# Patient Record
Sex: Female | Born: 1970 | Race: White | Hispanic: No | Marital: Married | Smoking: Never smoker
Health system: Southern US, Community
[De-identification: ages and names within clinical notes are randomized; demographics above are authoritative.]

## PROBLEM LIST (undated history)

## (undated) DIAGNOSIS — F32A Depression, unspecified: Secondary | ICD-10-CM

## (undated) DIAGNOSIS — L719 Rosacea, unspecified: Secondary | ICD-10-CM

## (undated) DIAGNOSIS — O24419 Gestational diabetes mellitus in pregnancy, unspecified control: Secondary | ICD-10-CM

## (undated) DIAGNOSIS — F419 Anxiety disorder, unspecified: Secondary | ICD-10-CM

## (undated) DIAGNOSIS — E785 Hyperlipidemia, unspecified: Secondary | ICD-10-CM

## (undated) DIAGNOSIS — F329 Major depressive disorder, single episode, unspecified: Secondary | ICD-10-CM

## (undated) DIAGNOSIS — C801 Malignant (primary) neoplasm, unspecified: Secondary | ICD-10-CM

## (undated) HISTORY — DX: Hyperlipidemia, unspecified: E78.5

## (undated) HISTORY — DX: Depression, unspecified: F32.A

## (undated) HISTORY — DX: Rosacea, unspecified: L71.9

## (undated) HISTORY — DX: Major depressive disorder, single episode, unspecified: F32.9

## (undated) HISTORY — DX: Anxiety disorder, unspecified: F41.9

## (undated) HISTORY — DX: Gestational diabetes mellitus in pregnancy, unspecified control: O24.419

---
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
## 1988-07-30 HISTORY — PX: WISDOM TOOTH EXTRACTION: SHX21

## 2004-08-22 ENCOUNTER — Encounter: Admission: RE | Admit: 2004-08-22 | Discharge: 2004-08-22 | Payer: Self-pay | Admitting: Obstetrics and Gynecology

## 2004-10-05 ENCOUNTER — Ambulatory Visit (HOSPITAL_COMMUNITY): Admission: RE | Admit: 2004-10-05 | Discharge: 2004-10-05 | Payer: Self-pay | Admitting: Obstetrics and Gynecology

## 2004-10-22 ENCOUNTER — Inpatient Hospital Stay (HOSPITAL_COMMUNITY): Admission: AD | Admit: 2004-10-22 | Discharge: 2004-10-24 | Payer: Self-pay | Admitting: Obstetrics and Gynecology

## 2004-10-22 DIAGNOSIS — O24419 Gestational diabetes mellitus in pregnancy, unspecified control: Secondary | ICD-10-CM

## 2010-06-06 ENCOUNTER — Ambulatory Visit: Payer: Self-pay

## 2011-07-09 ENCOUNTER — Ambulatory Visit: Payer: Self-pay

## 2012-07-21 HISTORY — PX: INTRAUTERINE DEVICE (IUD) INSERTION: SHX5877

## 2015-09-14 ENCOUNTER — Other Ambulatory Visit: Payer: Self-pay | Admitting: Certified Nurse Midwife

## 2015-09-14 DIAGNOSIS — Z1231 Encounter for screening mammogram for malignant neoplasm of breast: Secondary | ICD-10-CM

## 2016-10-04 ENCOUNTER — Ambulatory Visit (INDEPENDENT_AMBULATORY_CARE_PROVIDER_SITE_OTHER): Payer: BLUE CROSS/BLUE SHIELD | Admitting: Certified Nurse Midwife

## 2016-10-04 ENCOUNTER — Encounter: Payer: Self-pay | Admitting: Certified Nurse Midwife

## 2016-10-04 ENCOUNTER — Other Ambulatory Visit: Payer: Self-pay | Admitting: Certified Nurse Midwife

## 2016-10-04 VITALS — BP 102/62 | HR 86 | Ht 64.0 in | Wt 148.0 lb

## 2016-10-04 DIAGNOSIS — Z01419 Encounter for gynecological examination (general) (routine) without abnormal findings: Secondary | ICD-10-CM

## 2016-10-04 DIAGNOSIS — Z1231 Encounter for screening mammogram for malignant neoplasm of breast: Secondary | ICD-10-CM

## 2016-10-04 DIAGNOSIS — Z8632 Personal history of gestational diabetes: Secondary | ICD-10-CM | POA: Insufficient documentation

## 2016-10-04 DIAGNOSIS — Z803 Family history of malignant neoplasm of breast: Secondary | ICD-10-CM

## 2016-10-04 DIAGNOSIS — F32A Depression, unspecified: Secondary | ICD-10-CM | POA: Insufficient documentation

## 2016-10-04 DIAGNOSIS — F329 Major depressive disorder, single episode, unspecified: Secondary | ICD-10-CM | POA: Insufficient documentation

## 2016-10-04 DIAGNOSIS — Z30432 Encounter for removal of intrauterine contraceptive device: Secondary | ICD-10-CM | POA: Diagnosis not present

## 2016-10-04 DIAGNOSIS — Z124 Encounter for screening for malignant neoplasm of cervix: Secondary | ICD-10-CM

## 2016-10-04 NOTE — Progress Notes (Signed)
Gynecology Annual Exam  PCP: [facility]  Chief Complaint:  Chief Complaint  Patient presents with  . Gynecologic Exam  . iud removal    History of Present Illness: Patient is a 46 y.o. G1P1 presents for annual exam and IUD removal. Husband had a vasectomy last June and no longer needs Mirena for contraception.  The patient has no significant gyn complaints today. Is going to go see a functional doctor in the near future to evaluate her depression and for preventitative recommendations. On the Mirena IUD she has had occasional spotting. Had more of a normal menses with her LMP 09/29/2016. She is still bleeding lightly.  Dysmenorrhea: no   The patient is sexually active. She currently uses IUD: Mirena which was inserted 06/2012. Husband had vasecomy 12/2015 but has not gone back for post vas sperm analysis.  for contraception.  Her  last pap: was done 09/14/2015 and was NIL/NEG. No history of abnormal Pap smears and last mammogram: 08/25/2014 was negative. She does not do SBE. Mother had breast cancer at age 80. No genetic testing has been done  The patient has regular exercise: no.  The patient has ever been transfused or tattooed?: not asked.  The patient reports that domestic violence in her life is absent.    PCP is DR Glendon Axe at [facility]. PCP does screening labs for patient. Last lipid panel 2017 was WNL.  Review of Systems: Review of Systems  Constitutional: Negative for chills, fever and weight loss.  HENT: Negative for congestion, sinus pain and sore throat.   Eyes: Negative for blurred vision and pain.       Positive for eye irritation  Respiratory: Negative for hemoptysis, shortness of breath and wheezing.   Cardiovascular: Negative for chest pain, palpitations and leg swelling.  Gastrointestinal: Positive for heartburn. Negative for abdominal pain, blood in stool, diarrhea, nausea and vomiting.  Genitourinary: Negative for dysuria, frequency, hematuria and  urgency.       Positive for irregular bleeding  Musculoskeletal: Negative for back pain, joint pain and myalgias.  Skin: Negative for itching and rash.  Neurological: Negative for dizziness, tingling and headaches.  Endo/Heme/Allergies: Negative for environmental allergies and polydipsia. Does not bruise/bleed easily.       Negative for hirsutism   Psychiatric/Behavioral: Positive for depression. The patient is not nervous/anxious and does not have insomnia.     Past Medical History:  Past Medical History:  Diagnosis Date  . Anxiety   . Depression   . Gestational diabetes   . Hyperlipidemia   . Rosacea     Past Surgical History:  Past Surgical History:  Procedure Laterality Date  . INTRAUTERINE DEVICE (IUD) INSERTION  07/21/2012   mirena    Gynecologic History:  Patient's last menstrual period was 09/29/2016 (approximate).  Obstetric History: G1P1  SVD 10/23/2014 Gestational diabetes with pregnancy Family History:  Family History  Problem Relation Age of Onset  . Breast cancer Mother 46  . Hypertension Father   . Depression Father   . Alzheimer's disease Father   . Colon cancer Maternal Grandmother 39  . Prostate cancer Maternal Grandfather   . Bone cancer Paternal Grandfather 26    Social History:  Social History   Social History  . Marital status: Married    Spouse name: N/A  . Number of children: 1  . Years of education: N/A   Occupational History  . Not on file.   Social History Main Topics  . Smoking  status: Never Smoker  . Smokeless tobacco: Never Used  . Alcohol use Yes     Comment: rarely  . Drug use: No  . Sexual activity: Yes    Birth control/ protection: IUD   Other Topics Concern  . Not on file   Social History Narrative  . No narrative on file    Allergies:  Allergies  Allergen Reactions  . Neosporin [Neomycin-Bacitracin Zn-Polymyx]   . Sulfa Antibiotics Rash    Medications: Prior to Admission medications   Medication Sig  Start Date End Date Taking? Authorizing Provider  Ascorbic Acid (VITAMIN C) 1000 MG tablet Take 1,000 mg by mouth daily.   Yes Historical Provider, MD  Cholecalciferol (VITAMIN D3) 5000 units TABS Take by mouth.   Yes Historical Provider, MD  escitalopram (LEXAPRO) 10 MG tablet Take 10 mg by mouth daily.   Yes Historical Provider, MD  levonorgestrel (MIRENA) 20 MCG/24HR IUD 1 each by Intrauterine route once.   Yes Historical Provider, MD  Magnesium 200 MG TABS Take 1 tablet by mouth.   Yes Historical Provider, MD  Multiple Vitamin (MULTIVITAMIN) tablet Take 1 tablet by mouth daily.    Historical Provider, MD    Physical Exam Vitals: Blood pressure 102/62, pulse 86, height 5' 4"  (1.626 m), weight 148 lb (67.1 kg), last menstrual period 09/29/2016.  General: NAD HEENT: normocephalic, anicteric Thyroid: no enlargement, no palpable nodules Pulmonary: No increased work of breathing, CTAB Cardiovascular: RRR without murmur Breast: Breast symmetrical, no tenderness, no palpable nodules or masses, no skin or nipple retraction present, no nipple discharge.  No axillary or supraclavicular lymphadenopathy. Abdomen: soft, non-tender, non-distended.  Umbilicus without lesions.  No hepatomegaly, no masses palpable. No evidence of hernia  Genitourinary:  External: Normal external female genitalia.  Normal urethral meatus, normal  Bartholin's and Skene's glands.    Vagina: Normal vaginal mucosa, no evidence of prolapse, small amt blood in vault   Cervix: Grossly normal in appearance, small amt bleeding, IUD strings present. IUD strings grasped with ring forceps and IUD removed easily intact  Uterus: MP to RV, NSSC, mobile,NT   Adnexa: ovaries non-enlarged, no adnexal masses  Rectal: deferred  Lymphatic: no evidence of inguinal lymphadenopathy Extremities: no edema, erythema, or tenderness Neurologic: Grossly intact Psychiatric: mood appropriate, affect full        Assessment:46 year old WF with  normal well woman exam Desires IUD removal    Plan: Problem List Items Addressed This Visit    History of gestational diabetes   Family history of breast cancer in mother    Other Visit Diagnoses    Encounter for gynecological examination    -  Primary   Relevant Orders   IGP,rfx Aptima HPV all pth   Encounter for removal of intrauterine contraceptive device (IUD)       Screening for cervical cancer       Relevant Orders   IGP,rfx Aptima HPV all pth      1) Mammogram  - recommend yearly screening mammogram -patient to schedule at [facility], recommend 3D screening due to dense breasts  2.) Discussed calcium and vitamin requirements. Recommend increasing exercise  3) Routine healthcare maintenance including cholesterol, diabetes screeningthru PCP  6) Anticipatory guidance regarding perimenopausal bleeding   7) Follow up 1 year for routine annual  Dalia Heading, [place]

## 2016-10-06 LAB — IGP,RFX APTIMA HPV ALL PTH: PAP Smear Comment: 0

## 2016-10-29 ENCOUNTER — Ambulatory Visit
Admission: RE | Admit: 2016-10-29 | Discharge: 2016-10-29 | Disposition: A | Discharge status: Home / Self Care | Payer: BLUE CROSS/BLUE SHIELD | Source: Ambulatory Visit | Attending: Certified Nurse Midwife | Admitting: Certified Nurse Midwife

## 2016-10-29 ENCOUNTER — Encounter: Payer: Self-pay | Admitting: Radiology

## 2016-10-29 DIAGNOSIS — Z1231 Encounter for screening mammogram for malignant neoplasm of breast: Secondary | ICD-10-CM | POA: Diagnosis present

## 2016-11-06 ENCOUNTER — Other Ambulatory Visit: Payer: Self-pay | Admitting: *Deleted

## 2016-11-06 ENCOUNTER — Inpatient Hospital Stay
Admission: RE | Admit: 2016-11-06 | Discharge: 2016-11-06 | Disposition: A | Discharge status: Home / Self Care | Payer: Self-pay | Source: Ambulatory Visit | Attending: *Deleted | Admitting: *Deleted

## 2016-11-06 DIAGNOSIS — Z1231 Encounter for screening mammogram for malignant neoplasm of breast: Secondary | ICD-10-CM

## 2018-01-06 ENCOUNTER — Other Ambulatory Visit: Payer: Self-pay | Admitting: Certified Nurse Midwife

## 2018-02-07 ENCOUNTER — Ambulatory Visit: Payer: BLUE CROSS/BLUE SHIELD | Admitting: Certified Nurse Midwife

## 2018-03-12 ENCOUNTER — Ambulatory Visit: Payer: BLUE CROSS/BLUE SHIELD | Admitting: Certified Nurse Midwife

## 2018-04-06 NOTE — Progress Notes (Signed)
Gynecology Annual Exam  PCP: Katheren Shams  Chief Complaint:  Chief Complaint  Patient presents with  . Gynecologic Exam    No Complaints    History of Present Illness: Patient is a 47 y.o. WF G1P1 who presents for her annual gynecological exam   The patient has no significant gyn complaints today.  She had her Mirena IUD removed at her last annual 10/04/2016. After she had her IUD removed, she had a couple of menses in the fall of 2018, but has been amenorrheic since then. Denies hot flashes . Since her last annual she has seen a functional MD who has prescribed her progesterone 100 mgm that she takes daily, except for the 1st to the 5th of each month, which has helped her sleeping. She also takes N acetyl L cysteine, ascorbic acid, vitamin D3, omega 3 fatty acids, and saw palmetto (hair loss) supplements. Extensive labs by functional physician Modena Nunnery) revealed low estradiol The patient is sexually active. She currently uses vasectomy for contraception.  Her  last pap: was done 10/04/2016 and was NIL. No history of abnormal Pap smears and last mammogram: 10/29/2016 was negative. She does not do SBE. Mother had breast cancer at age 74. No genetic testing has been done  The patient has regular exercise: no.   The patient reports that domestic violence in her life is absent.   Past medical history is remarkable for depression/ anxiety, rosacea, and gestational diabetes.  PCP is DR Glendon Axe at [facility]. PCP does screening labs for patient. Last lipid panel 2018 was WNL.  Review of Systems: Review of Systems  Constitutional: Negative for chills, fever and weight loss.  HENT: Negative for congestion, sinus pain and sore throat.   Eyes: Negative for blurred vision and pain.       Positive for eye irritation  Respiratory: Negative for hemoptysis, shortness of breath and wheezing.   Cardiovascular: Negative for chest pain, palpitations and leg swelling.  Gastrointestinal: Positive  for heartburn. Negative for abdominal pain, blood in stool, diarrhea, nausea and vomiting.  Genitourinary: Negative for dysuria, frequency, hematuria and urgency.       Positive for irregular bleeding  Musculoskeletal: Negative for back pain, joint pain and myalgias.  Skin: Negative for itching and rash.  Neurological: Negative for dizziness, tingling and headaches.  Endo/Heme/Allergies: Negative for environmental allergies and polydipsia. Does not bruise/bleed easily.       Negative for hirsutism   Psychiatric/Behavioral: Positive for depression. The patient is not nervous/anxious and does not have insomnia.     Past Medical History:  Past Medical History:  Diagnosis Date  . Anxiety   . Depression   . Gestational diabetes   . Hyperlipidemia   . Rosacea     Past Surgical History:  Past Surgical History:  Procedure Laterality Date  . EYE SURGERY Bilateral 1978  . INTRAUTERINE DEVICE (IUD) INSERTION  07/21/2012   mirena  . [facility] EXTRACTION  1990    Gynecologic History:  No LMP recorded. (Menstrual status: Perimenopausal).  Obstetric History: G1P1  SVD 10/22/2004 Gestational diabetes with pregnancy Family History:  Family History  Problem Relation Age of Onset  . Breast cancer Mother 18       in 37  . Hypertension Father   . Depression Father   . Alzheimer's disease Father   . Colon cancer Maternal Grandmother 89  . Prostate cancer Maternal Grandfather   . Bone cancer Paternal Grandfather 17  . Colon cancer Paternal Grandmother  60  . Heart attack Paternal Grandmother        died from MI    Social History:  Social History   Socioeconomic History  . Marital status: Married    Spouse name: Not on file  . Number of children: 1  . Years of education: Not on file  . Highest education level: Not on file  Occupational History  . Not on file  Social Needs  . Financial resource strain: Not on file  . Food insecurity:    Worry: Not on file    Inability: Not  on file  . Transportation needs:    Medical: Not on file    Non-medical: Not on file  Tobacco Use  . Smoking status: Never Smoker  . Smokeless tobacco: Never Used  Substance and Sexual Activity  . Alcohol use: Yes    Comment: rarely  . Drug use: No  . Sexual activity: Yes    Partners: Male    Birth control/protection: IUD  Lifestyle  . Physical activity:    Days per week: 0 days    Minutes per session: Not on file  . Stress: Not on file  Relationships  . Social connections:    Talks on phone: Not on file    Gets together: Not on file    Attends religious service: Not on file    Active member of club or organization: Not on file    Attends meetings of clubs or organizations: Not on file    Relationship status: Not on file  . Intimate partner violence:    Fear of current or ex partner: Not on file    Emotionally abused: Not on file    Physically abused: Not on file    Forced sexual activity: Not on file  Other Topics Concern  . Not on file  Social History Narrative  . Not on file    Allergies:  Allergies  Allergen Reactions  . Neosporin [Neomycin-Bacitracin Zn-Polymyx]   . Sulfa Antibiotics Rash    Medications:  Current Outpatient Medications on File Prior to Visit  Medication Sig Dispense Refill  . Acetylcysteine (N-ACETYL-L-CYSTEINE PO) Take 1 tablet by mouth 2 (two) times daily.    . Ascorbic Acid (VITAMIN C) 1000 MG tablet Take 1,000 mg by mouth daily.    . Cholecalciferol (VITAMIN D3) 5000 units TABS Take by mouth.    . doxycycline (PERIOSTAT) 20 MG tablet TK 1 T PO QD  1  . escitalopram (LEXAPRO) 10 MG tablet Take 10 mg by mouth daily.    . Multiple Vitamin (MULTIVITAMIN) tablet Take 1 tablet by mouth daily.    . Omega-3 Fatty Acids (FISH OIL) 1000 MG CPDR Take 2 tablets by mouth daily.    . progesterone (PROMETRIUM) 100 MG capsule TK 2 CS PO ONCE A DAY ON DAYS 15 THROUGH 28 OF YOUR CYCLE  2  . saw palmetto 500 MG capsule Take 500 mg by mouth daily.    .  Magnesium 200 MG TABS Take 1 tablet by mouth.     No current facility-administered medications on file prior to visit.        Physical Exam Vitals: BP 94/60 (BP Location: Right Arm, Patient Position: Sitting, Cuff Size: Normal)   Pulse 92   Ht 5' 4"  (1.626 m)   Wt 145 lb (65.8 kg)   BMI 24.89 kg/m  General: WF in  NAD HEENT: normocephalic, anicteric Thyroid: no enlargement, no palpable nodules Pulmonary: No increased work of breathing, CTAB  Cardiovascular: RRR without murmur Breast: Breast symmetrical, no tenderness, no palpable nodules or masses, no skin or nipple retraction present, no nipple discharge.  No axillary or supraclavicular lymphadenopathy. Abdomen: soft, non-tender, non-distended.  Umbilicus without lesions.  No hepatomegaly, no masses palpable. No evidence of hernia  Genitourinary:  External: Normal external female genitalia.  Normal urethral meatus, normal  Bartholin's and Skene's glands.    Vagina: Normal vaginal mucosa, no evidence of prolapse  Cervix: Grossly normal in appearance, NT  Uterus: MP, NSSC, mobile,NT   Adnexa: ovaries non-enlarged, no adnexal masses  Rectal: deferred  Lymphatic: no evidence of inguinal lymphadenopathy Extremities: no edema, erythema, or tenderness Neurologic: Grossly intact Psychiatric: mood appropriate, affect full        Assessment:47 year old WF with normal well woman exam Perimenopausal bleeding pattern. Amenorrhea since Fall 2018    Plan:1) Mammogram  - recommend yearly screening mammogram -patient to schedule at [facility], recommend 3D screening due to dense breasts  2.) Discussed calcium and vitamin requirements. Recommend increasing exercise  3) Routine healthcare maintenance including cholesterol, diabetes screening thru PCP  4) Anticipatory guidance regarding perimenopausal bleeding   5) Pap done  6) Follow up 1 year for routine annual  Dalia Heading, [place]

## 2018-04-07 ENCOUNTER — Encounter: Payer: Self-pay | Admitting: Certified Nurse Midwife

## 2018-04-07 ENCOUNTER — Other Ambulatory Visit: Payer: Self-pay

## 2018-04-07 ENCOUNTER — Ambulatory Visit (INDEPENDENT_AMBULATORY_CARE_PROVIDER_SITE_OTHER): Payer: BLUE CROSS/BLUE SHIELD | Admitting: Certified Nurse Midwife

## 2018-04-07 ENCOUNTER — Other Ambulatory Visit (HOSPITAL_COMMUNITY)
Admission: RE | Admit: 2018-04-07 | Discharge: 2018-04-07 | Disposition: A | Discharge status: Home / Self Care | Payer: BLUE CROSS/BLUE SHIELD | Source: Ambulatory Visit | Attending: Obstetrics and Gynecology | Admitting: Obstetrics and Gynecology

## 2018-04-07 VITALS — BP 94/60 | HR 92 | Ht 64.0 in | Wt 145.0 lb

## 2018-04-07 DIAGNOSIS — Z01419 Encounter for gynecological examination (general) (routine) without abnormal findings: Secondary | ICD-10-CM | POA: Diagnosis present

## 2018-04-07 DIAGNOSIS — Z1272 Encounter for screening for malignant neoplasm of vagina: Secondary | ICD-10-CM | POA: Insufficient documentation

## 2018-04-07 DIAGNOSIS — Z1231 Encounter for screening mammogram for malignant neoplasm of breast: Secondary | ICD-10-CM

## 2018-04-07 DIAGNOSIS — Z124 Encounter for screening for malignant neoplasm of cervix: Secondary | ICD-10-CM

## 2018-04-07 DIAGNOSIS — Z1239 Encounter for other screening for malignant neoplasm of breast: Secondary | ICD-10-CM

## 2018-04-07 NOTE — Patient Instructions (Signed)
Preventing Osteoporosis, Adult Osteoporosis is a condition that causes the bones to get weaker. With osteoporosis, the bones become thinner, and the normal spaces in bone tissue become larger. This can make the bones weak and cause them to break more easily. People who have osteoporosis are more likely to break their wrist, spine, or hip. Even a minor accident or injury can be enough to break weak bones. Osteoporosis can occur with aging. Your body constantly replaces old bone tissue with new tissue. As you get older, you may lose bone tissue more quickly, or it may be replaced more slowly. Osteoporosis is more likely to develop if you have poor nutrition or do not get enough calcium or vitamin D. Other lifestyle factors can also play a role. By making some diet and lifestyle changes, you can help to keep your bones healthy and help to prevent osteoporosis. What nutrition changes can be made? Nutrition plays an important role in maintaining healthy, strong bones.  Make sure you get enough calcium every day from food or from calcium supplements. ? If you are age 49 or younger, aim to get 1,000 mg of calcium every day. ? If you are older than age 49, aim to get 1,200 mg of calcium every day.  Try to get enough vitamin D every day. ? If you are age 80 or younger, aim to get 600 international units (IU) every day. ? If you are older than age 22, aim to get 800 international units every day.  Follow a healthy diet. Eat plenty of foods that contain calcium and vitamin D. ? Calcium is in milk, cheese, yogurt, and other dairy products. Some fish and vegetables are also good sources of calcium. Many foods such as cereals and breads have had calcium added to them (are fortified). Check nutrition labels to see how much calcium is in a food or drink. ? Foods that contain vitamin D include milk, cereals, salmon, and tuna. Your body also makes vitamin D when you are out in the sun. Bare skin exposure to the sun on  your face, arms, legs, or back for no more than 30 minutes a day, 2 times per week is more than enough. Beyond that, it is important to use sunblock to protect your skin from sunburn, which increases your risk for skin cancer.  What lifestyle changes can be made? Making changes in your everyday life can also play an important role in preventing osteoporosis.  Stay active and get exercise every day. Ask your health care provider what types of exercise are best for you.  Do not use any products that contain nicotine or tobacco, such as cigarettes and e-cigarettes. If you need help quitting, ask your health care provider.  Limit alcohol intake to no more than 1 drink a day for nonpregnant women and 2 drinks a day for men. One drink equals 12 oz of beer, 5 oz of wine, or 1 oz of hard liquor.  Why are these changes important? Making these nutrition and lifestyle changes can:  Help you develop and maintain healthy, strong bones.  Prevent loss of bone mass and the problems that are caused by that loss, such as broken bones and delayed healing.  Make you feel better mentally and physically.  What can happen if changes are not made? Problems that can result from osteoporosis can be very serious. These may include:  A higher risk of broken bones that are painful and do not heal well.  Physical malformations, such as  a collapsed spine or a hunched back.  Problems with movement.  Where to find support: If you need help making changes to prevent osteoporosis, talk with your health care provider. You can ask for a referral to a diet and nutrition specialist (dietitian) and a physical therapist. Where to find more information: Learn more about osteoporosis from:  NIH Osteoporosis and Related [place]: www.niams.GolfingGoddess.com.br  U.S. Office on Women's Health:  SouvenirBaseball.es.html  National Osteoporosis Foundation: ProfilePeek.ch  Summary  Osteoporosis is a condition that causes weak bones that are more likely to break.  Eating a healthy diet and making sure you get enough calcium and vitamin D can help prevent osteoporosis.  Other ways to reduce your risk of osteoporosis include getting regular exercise and avoiding alcohol and products that contain nicotine or tobacco. This information is not intended to replace advice given to you by your health care provider. Make sure you discuss any questions you have with your health care provider. Document Released: 07/31/2015 Document Revised: 03/26/2016 Document Reviewed: 03/26/2016 Elsevier Interactive Patient Education  [employer].

## 2018-04-08 LAB — CYTOLOGY - PAP: Diagnosis: NEGATIVE

## 2018-04-09 ENCOUNTER — Encounter: Payer: Self-pay | Admitting: Certified Nurse Midwife

## 2018-04-22 ENCOUNTER — Ambulatory Visit
Admission: RE | Admit: 2018-04-22 | Discharge: 2018-04-22 | Disposition: A | Discharge status: Home / Self Care | Payer: BLUE CROSS/BLUE SHIELD | Source: Ambulatory Visit | Attending: Certified Nurse Midwife | Admitting: Certified Nurse Midwife

## 2018-04-22 ENCOUNTER — Encounter: Payer: Self-pay | Admitting: Radiology

## 2018-04-22 DIAGNOSIS — Z1231 Encounter for screening mammogram for malignant neoplasm of breast: Secondary | ICD-10-CM | POA: Diagnosis not present

## 2018-04-22 DIAGNOSIS — Z1239 Encounter for other screening for malignant neoplasm of breast: Secondary | ICD-10-CM

## 2019-03-30 ENCOUNTER — Other Ambulatory Visit: Payer: Self-pay | Admitting: Certified Nurse Midwife

## 2019-04-15 NOTE — Progress Notes (Signed)
Gynecology Annual Exam  PCP: [facility]  Chief Complaint:  Chief Complaint  Patient presents with  . Gynecologic Exam    History of Present Illness: Patient is a 48 y.o. WF G1P1 who presents for her annual gynecological exam  Since her last annual exam in 2019, she has lost her father to dementia. She also lost several other family members and some friends last fall.  She had her Mirena IUD removed 10/04/2016. After she had her IUD removed, she had a couple of menses in the fall of 2018. Had no further bleeding until April and May of this year. She had a "normal" period each of these months, lasting 5-7 days, with a medium flow..She sees a functional MD Modena Nunnery) who has prescribed her progesterone 100 mgm that she has been taking daily, except for the 1st to the 5th of each month for the last 2 years. She also takes N acetyl L cysteine, ascorbic acid, vitamin D3,, and saw palmetto (hair loss) supplements.  The patient is sexually active. She currently uses vasectomy for contraception.  Her  last pap: was done 04/07/2018  and was NIL. No history of abnormal Pap smears and last mammogram: 04/22/2018 was negative. She does not do SBE. Mother had breast cancer at age 58. No genetic testing has been done  The patient has regular exercise: Yes, walks 2 miles, 2-3 times a week.   Past medical history is remarkable for depression/ anxiety, rosacea, and gestational diabetes.   Last lipid panel 2019 was WNL.  Review of Systems: Review of Systems  Constitutional: Negative for chills, fever and weight loss.  HENT: Negative for congestion, sinus pain and sore throat.   Eyes: Negative for blurred vision and pain.  Respiratory: Negative for hemoptysis, shortness of breath and wheezing.   Cardiovascular: Negative for chest pain, palpitations and leg swelling.  Gastrointestinal: Negative for abdominal pain, blood in stool, diarrhea, heartburn, nausea and vomiting.  Genitourinary:  Negative for dysuria, frequency, hematuria and urgency.  Musculoskeletal: Positive for joint pain. Negative for back pain and myalgias.  Skin: Negative for itching and rash.  Neurological: Negative for dizziness, tingling and headaches.  Endo/Heme/Allergies: Negative for environmental allergies and polydipsia. Does not bruise/bleed easily.       Negative for hirsutism   Psychiatric/Behavioral: Negative for depression. The patient is not nervous/anxious and does not have insomnia.     Past Medical History:  Past Medical History:  Diagnosis Date  . Anxiety   . Depression   . Gestational diabetes   . Hyperlipidemia   . Rosacea     Past Surgical History:  Past Surgical History:  Procedure Laterality Date  . EYE SURGERY Bilateral 1978  . INTRAUTERINE DEVICE (IUD) INSERTION  07/21/2012   mirena  . [place] EXTRACTION  1990    Gynecologic History:  No LMP recorded (lmp unknown). Patient is perimenopausal.  Obstetric History: G1P1  SVD 10/22/2004 Gestational diabetes with pregnancy Family History:  Family History  Problem Relation Age of Onset  . Breast cancer Mother 39       in 24  . Hypertension Father   . Depression Father   . Alzheimer's disease Father   . Colon cancer Maternal Grandmother 33  . Prostate cancer Maternal Grandfather   . Bone cancer Paternal Grandfather 66  . Colon cancer Paternal Grandmother 73  . Heart attack Paternal Grandmother        died from MI    Social History:  Social  History   Socioeconomic History  . Marital status: Married    Spouse name: Not on file  . Number of children: 1  . Years of education: Not on file  . Highest education level: Not on file  Occupational History  . Not on file  Social Needs  . Financial resource strain: Not on file  . Food insecurity    Worry: Not on file    Inability: Not on file  . Transportation needs    Medical: Not on file    Non-medical: Not on file  Tobacco Use  . Smoking status: Never  Smoker  . Smokeless tobacco: Never Used  Substance and Sexual Activity  . Alcohol use: Yes    Comment: rarely  . Drug use: No  . Sexual activity: Yes    Partners: Male    Birth control/protection: Surgical    Comment: vasectomy  Lifestyle  . Physical activity    Days per week: 0 days    Minutes per session: Not on file  . Stress: Not on file  Relationships  . Social Herbalist on phone: Not on file    Gets together: Not on file    Attends religious service: Not on file    Active member of club or organization: Not on file    Attends meetings of clubs or organizations: Not on file    Relationship status: Not on file  . Intimate partner violence    Fear of current or ex partner: Not on file    Emotionally abused: Not on file    Physically abused: Not on file    Forced sexual activity: Not on file  Other Topics Concern  . Not on file  Social History Narrative  . Not on file    Allergies:  Allergies  Allergen Reactions  . Neosporin [Neomycin-Bacitracin Zn-Polymyx]   . Sulfa Antibiotics Rash    Medications:  Current Outpatient Medications on File Prior to Visit  Medication Sig Dispense Refill  . Acetylcysteine (N-ACETYL-L-CYSTEINE PO) Take 1 tablet by mouth 2 (two) times daily.    . Ascorbic Acid (VITAMIN C) 1000 MG tablet Take 1,000 mg by mouth daily.    . Cholecalciferol (VITAMIN D3) 5000 units TABS Take by mouth.    . escitalopram (LEXAPRO) 10 MG tablet Take 10 mg by mouth daily.    . Magnesium 200 MG TABS Take 1 tablet by mouth.    . Multiple Vitamin (MULTIVITAMIN) tablet Take 1 tablet by mouth daily.    . progesterone (PROMETRIUM) 100 MG capsule TK 2 CS PO ONCE A DAY ON DAYS 15 THROUGH 28 OF YOUR CYCLE  2  . progesterone (PROMETRIUM) 200 MG capsule Take 1 capsule by mouth daily.    Marland Kitchen saccharomyces boulardii (FLORASTOR) 250 MG capsule Take 1 capsule by mouth daily.    . saw palmetto 500 MG capsule Take 500 mg by mouth daily.     No current  facility-administered medications on file prior to visit.        Physical Exam Vitals: BP 100/60   Pulse 85   Ht 5' 4"  (1.626 m)   Wt 146 lb (66.2 kg)   LMP  (LMP Unknown)   BMI 25.06 kg/m  General: WF in  NAD HEENT: normocephalic, anicteric Thyroid: no enlargement, no palpable nodules Pulmonary: No increased work of breathing, CTAB Cardiovascular: RRR without murmur Breast: Breast symmetrical, no tenderness, no palpable nodules or masses, no skin or nipple retraction present, no nipple discharge.  No axillary or supraclavicular lymphadenopathy. Abdomen: soft, non-tender, non-distended.  Umbilicus without lesions.  No hepatomegaly, no masses palpable. No evidence of hernia  Genitourinary:  External: Normal external female genitalia.  Normal urethral meatus, normal Bartholin's and Skene's glands.    Vagina: Normal vaginal mucosa, no evidence of prolapse  Cervix: Grossly normal in appearance, NT  Uterus: MP, NSSC, mobile,NT   Adnexa: ovaries non-enlarged, no adnexal masses  Rectal: deferred  Lymphatic: no evidence of inguinal lymphadenopathy Extremities: no edema, erythema, or tenderness Neurologic: Grossly intact Psychiatric: mood appropriate, affect full        Assessment:48 year old WF with normal well woman exam ? Postmenopausal vs perimenopausal bleeding-is currently taking progesterone which reduces her risk of endometrial hyperplasia    Plan:1) Mammogram  - recommend yearly screening mammogram -mammogram scheduled, patient to scheduled after 04/23/2019  2.) Discussed calcium and vitamin requirements. Recommend increasing exercise  3 Routine screening for cholesterol and diabetes UTD.   4) Advised to let me know if has any further bleeding. Will order pelvic ultrasound to evaluate endometrium  5) Cervical cancer screening: Pap done. Discussed [employer] insurance recent policy change on reimbursing for Pap smears. May need to pay for Pap smears if done more  frequently than every 3 years.  6) Colon cancer screening: Explained options for colon cancer screening (hemoccult, Cologuard, colonoscopy). She desires to do Cologuard.   7) Follow up 1 year for routine annual  Dalia Heading, [place]

## 2019-04-16 ENCOUNTER — Other Ambulatory Visit (HOSPITAL_COMMUNITY)
Admission: RE | Admit: 2019-04-16 | Discharge: 2019-04-16 | Disposition: A | Discharge status: Home / Self Care | Payer: BC Managed Care – PPO | Source: Ambulatory Visit | Attending: Certified Nurse Midwife | Admitting: Certified Nurse Midwife

## 2019-04-16 ENCOUNTER — Ambulatory Visit (INDEPENDENT_AMBULATORY_CARE_PROVIDER_SITE_OTHER): Payer: BC Managed Care – PPO | Admitting: Certified Nurse Midwife

## 2019-04-16 ENCOUNTER — Encounter: Payer: Self-pay | Admitting: Certified Nurse Midwife

## 2019-04-16 ENCOUNTER — Other Ambulatory Visit: Payer: Self-pay

## 2019-04-16 VITALS — BP 100/60 | HR 85 | Ht 64.0 in | Wt 146.0 lb

## 2019-04-16 DIAGNOSIS — Z124 Encounter for screening for malignant neoplasm of cervix: Secondary | ICD-10-CM | POA: Insufficient documentation

## 2019-04-16 DIAGNOSIS — Z1211 Encounter for screening for malignant neoplasm of colon: Secondary | ICD-10-CM

## 2019-04-16 DIAGNOSIS — Z01419 Encounter for gynecological examination (general) (routine) without abnormal findings: Secondary | ICD-10-CM | POA: Insufficient documentation

## 2019-04-16 DIAGNOSIS — Z1239 Encounter for other screening for malignant neoplasm of breast: Secondary | ICD-10-CM

## 2019-04-21 LAB — CYTOLOGY - PAP
Diagnosis: NEGATIVE
High risk HPV: NEGATIVE
Molecular Disclaimer: 56
Molecular Disclaimer: DETECTED
Molecular Disclaimer: NORMAL

## 2019-04-21 IMAGING — MG MM DIGITAL SCREENING BILAT W/ TOMO W/ CAD
8 series · 8 of 24 positions shown · non-contrast
Comparison: Previous exam(s).

CLINICAL DATA: Screening.

EXAM:
DIGITAL SCREENING BILATERAL MAMMOGRAM WITH TOMO AND CAD

[R MLO synth-2D]
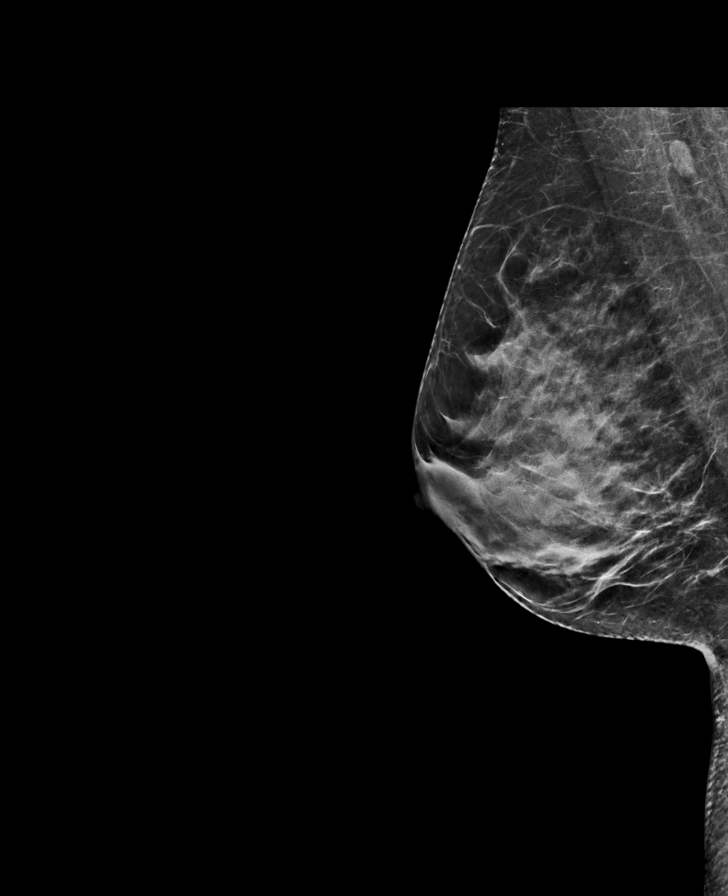

[L CC synth-2D]
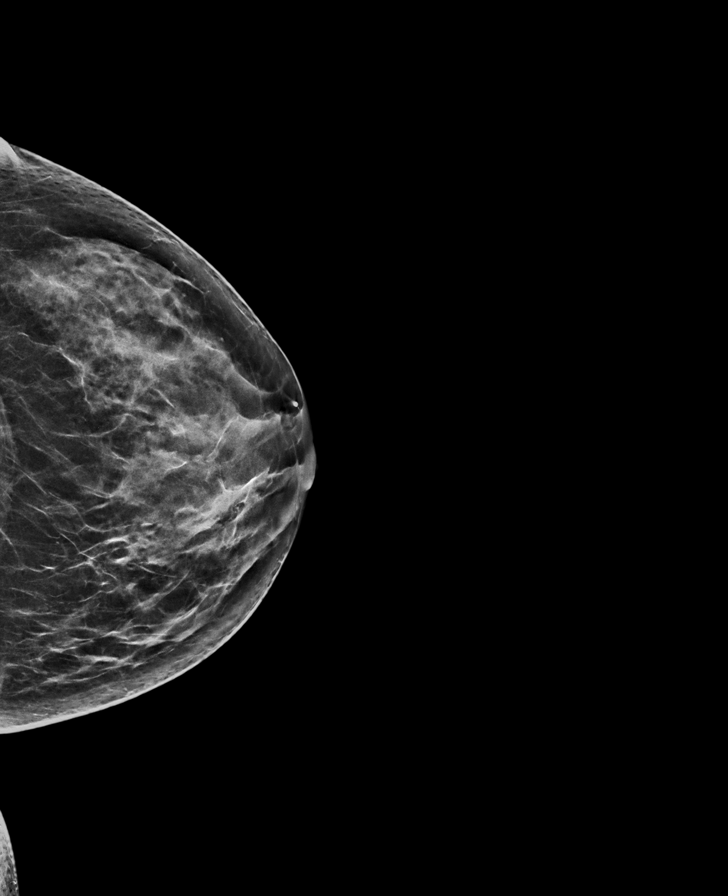

[L MLO synth-2D]
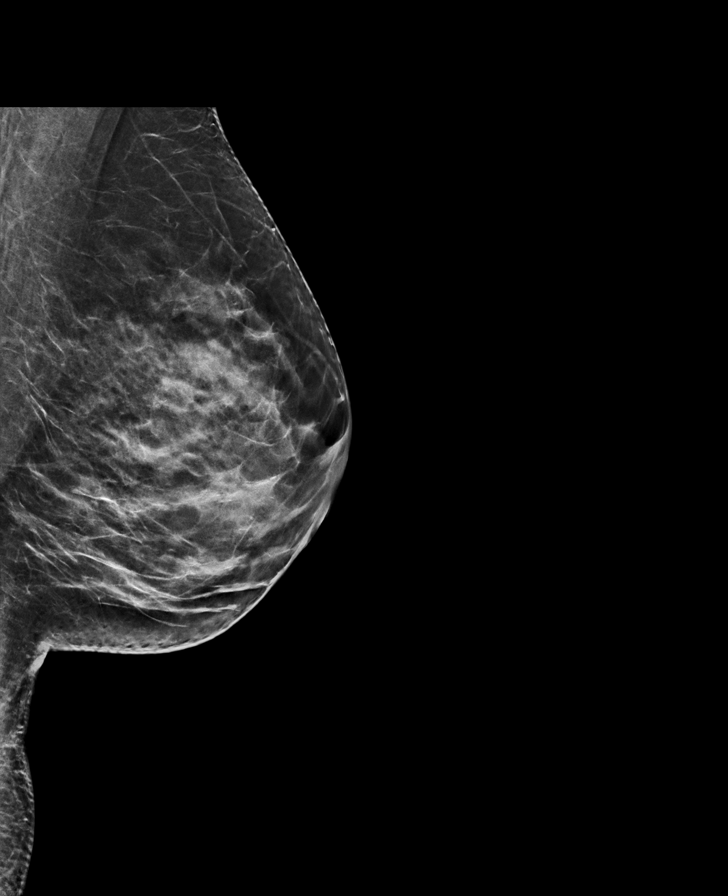

[R CC synth-2D]
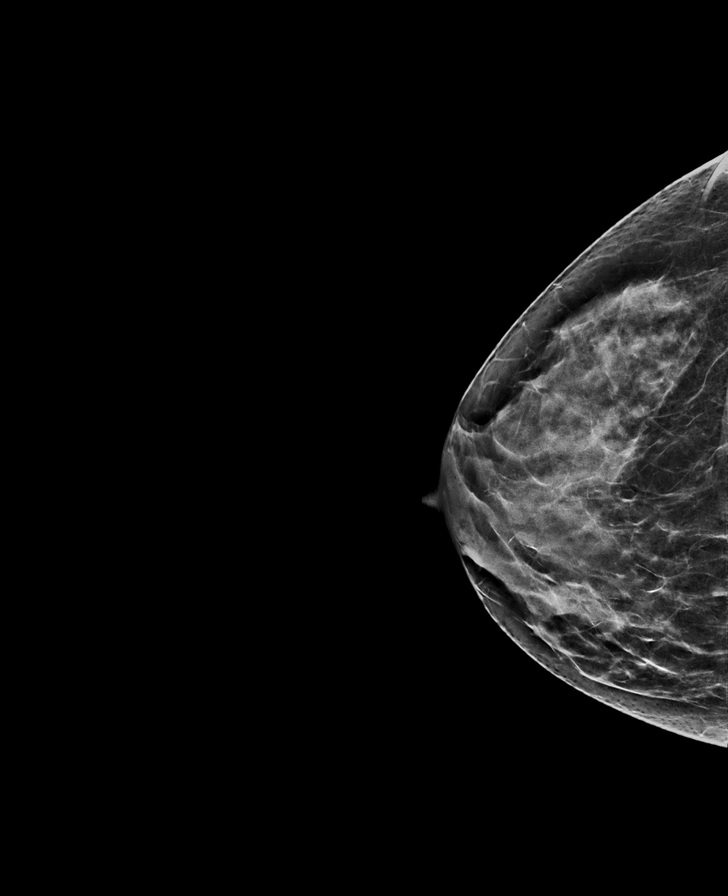

[R MLO tomo · tomo slice 31/61.0]
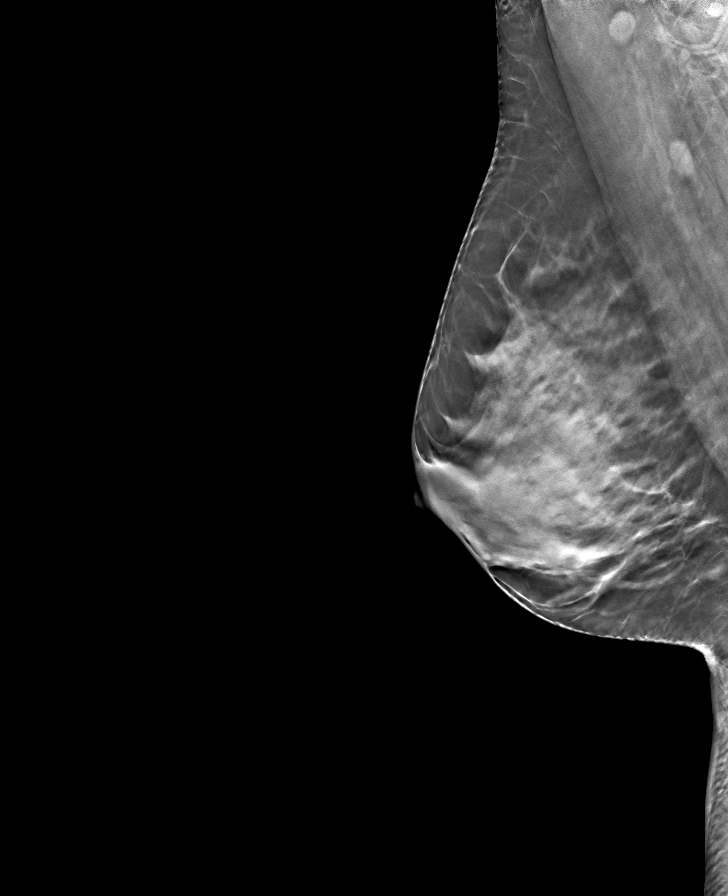

[R CC tomo · tomo slice 31/60.0]
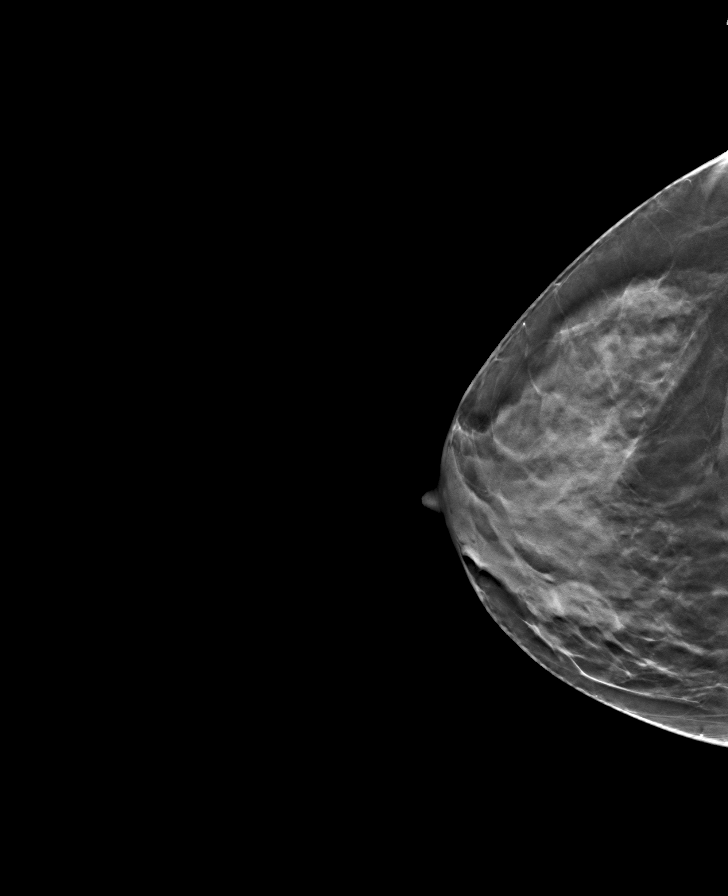

[L CC tomo · tomo slice 31/62.0]
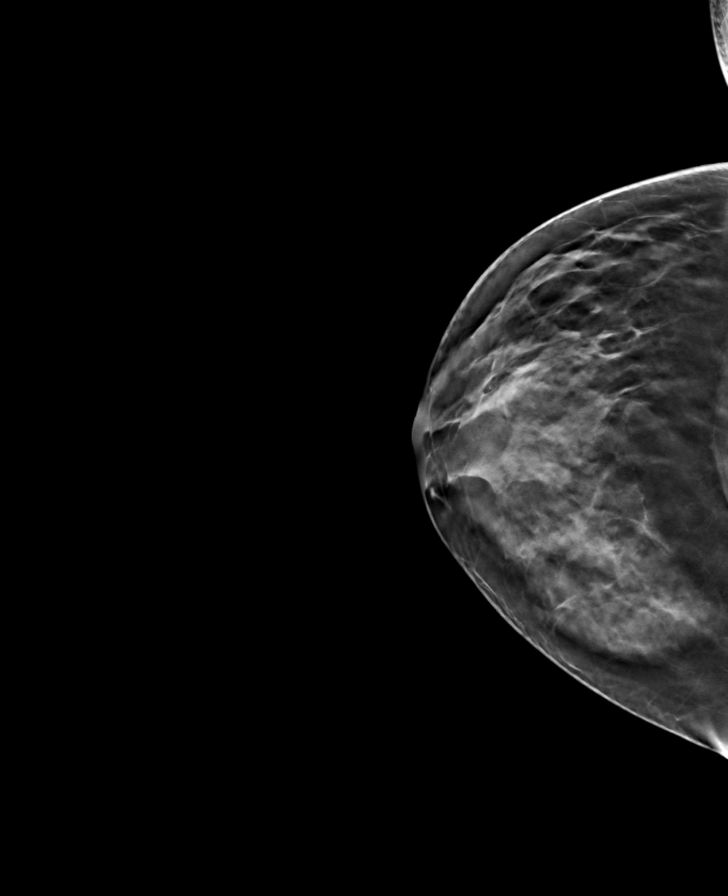

[L MLO tomo · tomo slice 31/62.0]
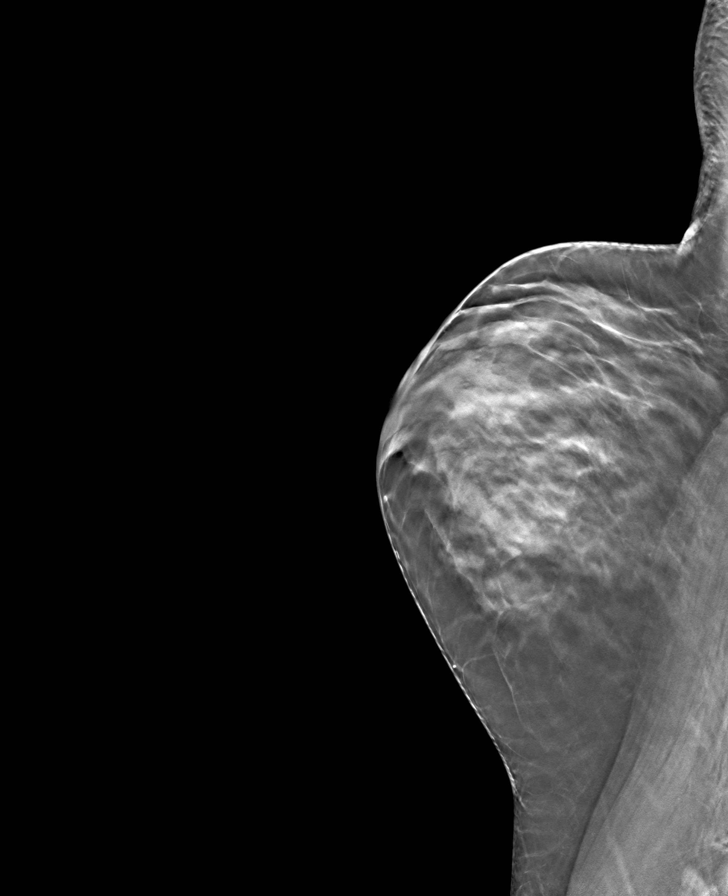

[8 of 24 positions shown; findings below may reference images not displayed]

ACR Breast Density Category d: The breast tissue is extremely dense,
which lowers the sensitivity of mammography.
FINDINGS: There are no findings suspicious for malignancy. Images were
processed with CAD.
IMPRESSION: No mammographic evidence of malignancy. A result letter of this
screening mammogram will be mailed directly to the patient.

RECOMMENDATION:
Screening mammogram in one year. (Code:RA-I-AVB)

BI-RADS CATEGORY  1: Negative.

## 2019-05-15 LAB — COLOGUARD

## 2019-05-26 ENCOUNTER — Ambulatory Visit
Admission: RE | Admit: 2019-05-26 | Discharge: 2019-05-26 | Disposition: A | Discharge status: Home / Self Care | Payer: BC Managed Care – PPO | Source: Ambulatory Visit | Attending: Certified Nurse Midwife | Admitting: Certified Nurse Midwife

## 2019-05-26 DIAGNOSIS — Z1231 Encounter for screening mammogram for malignant neoplasm of breast: Secondary | ICD-10-CM | POA: Diagnosis not present

## 2019-05-26 DIAGNOSIS — Z1239 Encounter for other screening for malignant neoplasm of breast: Secondary | ICD-10-CM | POA: Diagnosis present

## 2019-05-26 HISTORY — DX: Malignant (primary) neoplasm, unspecified: C80.1

## 2019-07-03 ENCOUNTER — Telehealth: Payer: Self-pay

## 2019-07-03 NOTE — Telephone Encounter (Signed)
Pt aware of NEGATIVE cologuard test results.

## 2019-10-13 ENCOUNTER — Ambulatory Visit (INDEPENDENT_AMBULATORY_CARE_PROVIDER_SITE_OTHER): Payer: BC Managed Care – PPO

## 2019-10-13 ENCOUNTER — Ambulatory Visit (INDEPENDENT_AMBULATORY_CARE_PROVIDER_SITE_OTHER): Payer: BC Managed Care – PPO | Admitting: Obstetrics and Gynecology

## 2019-10-13 ENCOUNTER — Encounter: Payer: Self-pay | Admitting: Obstetrics and Gynecology

## 2019-10-13 ENCOUNTER — Other Ambulatory Visit: Payer: Self-pay

## 2019-10-13 VITALS — BP 90/60 | Ht 63.0 in | Wt 140.0 lb

## 2019-10-13 DIAGNOSIS — R102 Pelvic and perineal pain: Secondary | ICD-10-CM | POA: Diagnosis not present

## 2019-10-13 DIAGNOSIS — N898 Other specified noninflammatory disorders of vagina: Secondary | ICD-10-CM | POA: Diagnosis not present

## 2019-10-13 NOTE — Patient Instructions (Signed)
I value your feedback and entrusting [place] with your care. If you get a  patient survey, I would appreciate you taking the time to let us know about your experience today. Thank you!  As of July 09, 2019, your lab results will be released to your MyChart immediately, before I even have a chance to see them. Please give me time to review them and contact you if there are any abnormalities. Thank you for your patience.

## 2019-10-13 NOTE — Progress Notes (Signed)
Medicine, [facility]   Chief Complaint  Patient presents with  . Pelvic Pain    tenderness x 3-4, no uti sx    HPI:      [name] is a 49 y.o. G1P1 who LMP was No LMP recorded. Patient is perimenopausal., presents today for a constant pelvic awareness/discomfort (not pain/cramping) for the past 3-4 days. Area is tender to touch; also with intermittent low back discomfort. No meds needed to treat. No GI sx (has regular, normal BMs), no urin sx, no vag sx. Has had increased d/c without itch/irritation/odor. She is sex active, no unusual pain/postcoital bleeding.  LMP 5/20. No vag bleeding/spotting.  No prior hx of ovar cysts/GYN issues.  Last pap 9/20 was neg. Neg cologuard 10/20. Last annual 10/20. FH colon cancer in 2nd degree relatives.   Past Medical History:  Diagnosis Date  . Anxiety   . Cancer ([place])    skin  . Depression   . Gestational diabetes   . Hyperlipidemia   . Rosacea     Past Surgical History:  Procedure Laterality Date  . EYE SURGERY Bilateral 1978  . INTRAUTERINE DEVICE (IUD) INSERTION  07/21/2012   mirena  . WISDOM TOOTH EXTRACTION  1990    Family History  Problem Relation Age of Onset  . Breast cancer Mother 16       in 59  . Hypertension Father   . Depression Father   . Alzheimer's disease Father   . Colon cancer Maternal Grandmother 53  . Prostate cancer Maternal Grandfather   . Bone cancer Paternal Grandfather 58  . Colon cancer Paternal Grandmother 34  . Heart attack Paternal Grandmother        died from MI    Social History   Socioeconomic History  . Marital status: Married    Spouse name: Not on file  . Number of children: 1  . Years of education: Not on file  . Highest education level: Not on file  Occupational History  . Not on file  Tobacco Use  . Smoking status: Never Smoker  . Smokeless tobacco: Never Used  Substance and Sexual Activity  . Alcohol use: Yes    Comment: rarely  . Drug use: No  .  Sexual activity: Yes    Partners: Male    Birth control/protection: Surgical    Comment: vasectomy  Other Topics Concern  . Not on file  Social History Narrative  . Not on file   Social Determinants of Health   Financial Resource Strain:   . Difficulty of Paying Living Expenses:   Food Insecurity:   . Worried About Charity fundraiser in the Last Year:   . Arboriculturist in the Last Year:   Transportation Needs:   . Film/video editor (Medical):   Marland Kitchen Lack of Transportation (Non-Medical):   Physical Activity:   . Days of Exercise per Week:   . Minutes of Exercise per Session:   Stress:   . Feeling of Stress :   Social Connections:   . Frequency of Communication with Friends and Family:   . Frequency of Social Gatherings with Friends and Family:   . Attends Religious Services:   . Active Member of Clubs or Organizations:   . Attends Archivist Meetings:   Marland Kitchen Marital Status:   Intimate Partner Violence:   . Fear of Current or Ex-Partner:   . Emotionally Abused:   Marland Kitchen Physically Abused:   . Sexually  Abused:     Outpatient Medications Prior to Visit  Medication Sig Dispense Refill  . Acetylcysteine (N-ACETYL-L-CYSTEINE PO) Take 1 tablet by mouth 2 (two) times daily.    . Ascorbic Acid (VITAMIN C) 1000 MG tablet Take 1,000 mg by mouth daily.    . Cholecalciferol (VITAMIN D3) 5000 units TABS Take by mouth.    . escitalopram (LEXAPRO) 10 MG tablet Take 10 mg by mouth daily.    . Magnesium 200 MG TABS Take 1 tablet by mouth.    . progesterone (PROMETRIUM) 200 MG capsule Take 1 capsule by mouth daily.    . saw palmetto 500 MG capsule Take 500 mg by mouth daily.    . Multiple Vitamin (MULTIVITAMIN) tablet Take 1 tablet by mouth daily.    . progesterone (PROMETRIUM) 100 MG capsule TK 2 CS PO ONCE A DAY ON DAYS 15 THROUGH 28 OF YOUR CYCLE  2  . saccharomyces boulardii (FLORASTOR) 250 MG capsule Take 1 capsule by mouth daily.     No facility-administered medications  prior to visit.      ROS:  Review of Systems  Constitutional: Negative for fever.  Gastrointestinal: Negative for blood in stool, constipation, diarrhea, nausea and vomiting.  Genitourinary: Positive for pelvic pain and vaginal discharge. Negative for dyspareunia, dysuria, flank pain, frequency, hematuria, urgency, vaginal bleeding and vaginal pain.  Musculoskeletal: Negative for back pain.  Skin: Negative for rash.  BREAST: No symptoms   OBJECTIVE:   Vitals:  BP 90/60   Ht 5' 3"  (1.6 m)   Wt 140 lb (63.5 kg)   BMI 24.80 kg/m   Physical Exam Vitals reviewed.  Constitutional:      Appearance: She is well-developed.  Pulmonary:     Effort: Pulmonary effort is normal.  Abdominal:     Palpations: Abdomen is soft.     Tenderness: There is abdominal tenderness in the suprapubic area. There is no guarding or rebound.  Genitourinary:    General: Normal vulva.     Pubic Area: No rash.      Labia:        Right: No rash, tenderness or lesion.        Left: No rash, tenderness or lesion.      Vagina: Normal. No vaginal discharge, erythema or tenderness.     Cervix: Normal.     Uterus: Normal. Tender. Not enlarged.      Adnexa: Right adnexa normal and left adnexa normal.       Right: No mass or tenderness.         Left: No mass or tenderness.    Musculoskeletal:        General: Normal range of motion.     Cervical back: Normal range of motion.  Skin:    General: Skin is warm and dry.  Neurological:     General: No focal deficit present.     Mental Status: She is alert and oriented to person, place, and time.  Psychiatric:        Mood and Affect: Mood normal.        Behavior: Behavior normal.        Thought Content: Thought content normal.        Judgment: Judgment normal.    Results: ULTRASOUND REPORT  Location: [facility]  Date of Service: 10/13/2019    Indications:Pelvic Pain   Findings:  The uterus is anteverted and measures 8.6 x 4.6 x 3.7 mm.  Echo texture is homogenous without evidence of focal  masses. The Endometrium measures 3.7 to 11.8 mm.  Right Ovary measures 3.8 x 1.7 x 1.6 cm. It is normal in appearance. There is a follicle in the right ovary measuring 18.7 x 13.8 x 13.4 mm Left Ovary measures 2.1 x 0.8 x 0.9 cm. It is normal in appearance. Survey of the adnexa demonstrates no adnexal masses. There is no free fluid in the cul de sac.  Impression: 1. There are limited views of the endometrium.  2. Normal ovaries.   Recommendations: 1.Clinical correlation with the patient's History and Physical Exam.   Gweneth Dimitri, RT  Assessment/Plan: Pelvic pain - Plan: US PELVIS TRANSVAGINAL NON-OB (TV ONLY)--Tender on exam, neg GYN u/s. No urin/GI sx. Question MSK vs premenstrual sx. Given neg GYN u/s and stable sx, follow expectantly. May have a period, especially given follicle on RTO. F/u prn.     Return if symptoms worsen or fail to improve.  Alicia B. Copland, PA-C 10/13/2019 3:08 PM

## 2020-04-20 ENCOUNTER — Ambulatory Visit: Payer: BC Managed Care – PPO | Admitting: Obstetrics and Gynecology

## 2020-06-06 ENCOUNTER — Other Ambulatory Visit: Payer: Self-pay | Admitting: Gynecology

## 2020-06-06 DIAGNOSIS — Z1231 Encounter for screening mammogram for malignant neoplasm of breast: Secondary | ICD-10-CM

## 2020-07-07 ENCOUNTER — Ambulatory Visit
Admission: RE | Admit: 2020-07-07 | Discharge: 2020-07-07 | Disposition: A | Discharge status: Home / Self Care | Payer: BC Managed Care – PPO | Source: Ambulatory Visit | Attending: Gynecology | Admitting: Gynecology

## 2020-07-07 ENCOUNTER — Other Ambulatory Visit: Payer: Self-pay

## 2020-07-07 DIAGNOSIS — Z1231 Encounter for screening mammogram for malignant neoplasm of breast: Secondary | ICD-10-CM | POA: Diagnosis not present

## 2020-12-12 ENCOUNTER — Other Ambulatory Visit: Payer: Self-pay | Admitting: Gynecology

## 2020-12-12 DIAGNOSIS — Z13820 Encounter for screening for osteoporosis: Secondary | ICD-10-CM

## 2020-12-22 ENCOUNTER — Other Ambulatory Visit: Payer: Self-pay

## 2020-12-22 ENCOUNTER — Ambulatory Visit
Admission: RE | Admit: 2020-12-22 | Discharge: 2020-12-22 | Disposition: A | Discharge status: Home / Self Care | Payer: BC Managed Care – PPO | Source: Ambulatory Visit | Attending: Gynecology | Admitting: Gynecology

## 2020-12-22 DIAGNOSIS — Z13820 Encounter for screening for osteoporosis: Secondary | ICD-10-CM | POA: Diagnosis present

## 2021-06-07 ENCOUNTER — Other Ambulatory Visit: Payer: Self-pay | Admitting: Gynecology

## 2021-06-07 DIAGNOSIS — Z1231 Encounter for screening mammogram for malignant neoplasm of breast: Secondary | ICD-10-CM

## 2021-07-10 ENCOUNTER — Other Ambulatory Visit: Payer: Self-pay

## 2021-07-10 ENCOUNTER — Ambulatory Visit
Admission: RE | Admit: 2021-07-10 | Discharge: 2021-07-10 | Disposition: A | Discharge status: Home / Self Care | Payer: BC Managed Care – PPO | Source: Ambulatory Visit | Attending: Gynecology | Admitting: Gynecology

## 2021-07-10 DIAGNOSIS — Z1231 Encounter for screening mammogram for malignant neoplasm of breast: Secondary | ICD-10-CM

## 2022-06-04 ENCOUNTER — Other Ambulatory Visit: Payer: Self-pay | Admitting: Obstetrics and Gynecology

## 2022-06-04 DIAGNOSIS — Z1231 Encounter for screening mammogram for malignant neoplasm of breast: Secondary | ICD-10-CM

## 2022-07-11 ENCOUNTER — Ambulatory Visit
Admission: RE | Admit: 2022-07-11 | Discharge: 2022-07-11 | Disposition: A | Discharge status: Home / Self Care | Payer: BC Managed Care – PPO | Source: Ambulatory Visit | Attending: Obstetrics and Gynecology | Admitting: Obstetrics and Gynecology

## 2022-07-11 DIAGNOSIS — Z1231 Encounter for screening mammogram for malignant neoplasm of breast: Secondary | ICD-10-CM | POA: Diagnosis not present

## 2023-02-20 ENCOUNTER — Ambulatory Visit: Payer: 59

## 2023-02-20 DIAGNOSIS — Z1211 Encounter for screening for malignant neoplasm of colon: Secondary | ICD-10-CM | POA: Diagnosis present

## 2023-02-20 DIAGNOSIS — K640 First degree hemorrhoids: Secondary | ICD-10-CM | POA: Diagnosis not present

## 2023-02-20 DIAGNOSIS — K621 Rectal polyp: Secondary | ICD-10-CM | POA: Diagnosis not present

## 2023-02-20 DIAGNOSIS — K635 Polyp of colon: Secondary | ICD-10-CM | POA: Diagnosis not present

## 2023-06-04 ENCOUNTER — Other Ambulatory Visit: Payer: Self-pay | Admitting: Obstetrics and Gynecology

## 2023-06-04 DIAGNOSIS — Z1231 Encounter for screening mammogram for malignant neoplasm of breast: Secondary | ICD-10-CM

## 2023-07-29 ENCOUNTER — Ambulatory Visit
Admission: RE | Admit: 2023-07-29 | Discharge: 2023-07-29 | Disposition: A | Discharge status: Home / Self Care | Payer: 59 | Source: Ambulatory Visit | Attending: Obstetrics and Gynecology | Admitting: Obstetrics and Gynecology

## 2023-07-29 DIAGNOSIS — Z1231 Encounter for screening mammogram for malignant neoplasm of breast: Secondary | ICD-10-CM | POA: Insufficient documentation

## 2024-06-04 ENCOUNTER — Other Ambulatory Visit: Payer: Self-pay | Admitting: Obstetrics and Gynecology

## 2024-06-04 DIAGNOSIS — Z1231 Encounter for screening mammogram for malignant neoplasm of breast: Secondary | ICD-10-CM

## 2024-07-29 ENCOUNTER — Ambulatory Visit
Admission: RE | Admit: 2024-07-29 | Discharge: 2024-07-29 | Disposition: A | Discharge status: Home / Self Care | Source: Ambulatory Visit | Attending: Obstetrics and Gynecology | Admitting: Obstetrics and Gynecology

## 2024-07-29 DIAGNOSIS — Z1231 Encounter for screening mammogram for malignant neoplasm of breast: Secondary | ICD-10-CM | POA: Insufficient documentation
